# Patient Record
Sex: Female | Born: 1935 | Race: Black or African American | Hispanic: No | Marital: Single
Health system: Southern US, Community
[De-identification: ages and names within clinical notes are randomized; demographics above are authoritative.]

---

## 2014-01-02 ENCOUNTER — Institutional Professional Consult (permissible substitution)
Admission: AD | Admit: 2014-01-02 | Discharge: 2014-01-29 | Disposition: A | Discharge status: Skilled Nursing Facility | Payer: Self-pay | Source: Ambulatory Visit | Attending: Internal Medicine | Admitting: Internal Medicine

## 2014-01-02 DIAGNOSIS — L0291 Cutaneous abscess, unspecified: Secondary | ICD-10-CM

## 2014-01-02 DIAGNOSIS — K56609 Unspecified intestinal obstruction, unspecified as to partial versus complete obstruction: Secondary | ICD-10-CM

## 2014-01-02 DIAGNOSIS — K630 Abscess of intestine: Secondary | ICD-10-CM

## 2014-01-03 ENCOUNTER — Other Ambulatory Visit (HOSPITAL_COMMUNITY): Payer: Self-pay

## 2014-01-03 LAB — COMPREHENSIVE METABOLIC PANEL
ALBUMIN: 1.5 g/dL — AB (ref 3.5–5.2)
ALK PHOS: 123 U/L — AB (ref 39–117)
ALT: 45 U/L — ABNORMAL HIGH (ref 0–35)
AST: 52 U/L — ABNORMAL HIGH (ref 0–37)
BUN: 15 mg/dL (ref 6–23)
CHLORIDE: 106 meq/L (ref 96–112)
CO2: 21 mEq/L (ref 19–32)
CREATININE: 0.59 mg/dL (ref 0.50–1.10)
Calcium: 7.9 mg/dL — ABNORMAL LOW (ref 8.4–10.5)
GFR calc Af Amer: >90 mL/min (ref >90)
GFR calc non Af Amer: 81 mL/min — ABNORMAL LOW (ref >90)
GLUCOSE: 114 mg/dL — AB (ref 70–99)
Potassium: 3.9 mEq/L (ref 3.7–5.3)
Sodium: 140 mEq/L (ref 137–147)
Total Bilirubin: 0.3 mg/dL (ref 0.3–1.2)
Total Protein: 4.8 g/dL — ABNORMAL LOW (ref 6.0–8.3)

## 2014-01-03 LAB — CBC
HEMATOCRIT: 23.8 % — AB (ref 36.0–46.0)
HEMOGLOBIN: 8 g/dL — AB (ref 12.0–15.0)
MCH: 28.3 pg (ref 26.0–34.0)
MCHC: 33.6 g/dL (ref 30.0–36.0)
MCV: 84.1 fL (ref 78.0–100.0)
Platelets: 432 10*3/uL — ABNORMAL HIGH (ref 150–400)
RBC: 2.83 MIL/uL — ABNORMAL LOW (ref 3.87–5.11)
RDW: 14.7 % (ref 11.5–15.5)
WBC: 15.3 10*3/uL — AB (ref 4.0–10.5)

## 2014-01-03 LAB — PREALBUMIN: Prealbumin: 7.6 mg/dL — ABNORMAL LOW (ref 17.0–34.0)

## 2014-01-03 LAB — TSH: TSH: 6.33 u[IU]/mL — ABNORMAL HIGH (ref 0.350–4.500)

## 2014-01-03 LAB — PROCALCITONIN: PROCALCITONIN: 0.95 ng/mL

## 2014-01-04 LAB — T4, FREE: Free T4: 1.06 ng/dL (ref 0.80–1.80)

## 2014-01-04 LAB — CBC
HCT: 23 % — ABNORMAL LOW (ref 36.0–46.0)
Hemoglobin: 7.7 g/dL — ABNORMAL LOW (ref 12.0–15.0)
MCH: 27.9 pg (ref 26.0–34.0)
MCHC: 33.5 g/dL (ref 30.0–36.0)
MCV: 83.3 fL (ref 78.0–100.0)
Platelets: 415 10*3/uL — ABNORMAL HIGH (ref 150–400)
RBC: 2.76 MIL/uL — AB (ref 3.87–5.11)
RDW: 14.6 % (ref 11.5–15.5)
WBC: 16.5 10*3/uL — ABNORMAL HIGH (ref 4.0–10.5)

## 2014-01-04 LAB — T3: T3, Total: 81.8 ng/dl (ref 80.0–204.0)

## 2014-01-04 LAB — VANCOMYCIN, TROUGH: VANCOMYCIN TR: 5.1 ug/mL — AB (ref 10.0–20.0)

## 2014-01-06 LAB — CBC WITH DIFFERENTIAL/PLATELET
Basophils Absolute: 0 10*3/uL (ref 0.0–0.1)
Basophils Relative: 0 % (ref 0–1)
EOS ABS: 0 10*3/uL (ref 0.0–0.7)
Eosinophils Relative: 0 % (ref 0–5)
HCT: 22.9 % — ABNORMAL LOW (ref 36.0–46.0)
Hemoglobin: 7.7 g/dL — ABNORMAL LOW (ref 12.0–15.0)
LYMPHS ABS: 1 10*3/uL (ref 0.7–4.0)
Lymphocytes Relative: 7 % — ABNORMAL LOW (ref 12–46)
MCH: 27.9 pg (ref 26.0–34.0)
MCHC: 33.6 g/dL (ref 30.0–36.0)
MCV: 83 fL (ref 78.0–100.0)
Monocytes Absolute: 2.2 10*3/uL — ABNORMAL HIGH (ref 0.1–1.0)
Monocytes Relative: 16 % — ABNORMAL HIGH (ref 3–12)
NEUTROS ABS: 10.8 10*3/uL — AB (ref 1.7–7.7)
NEUTROS PCT: 77 % (ref 43–77)
Platelets: 536 10*3/uL — ABNORMAL HIGH (ref 150–400)
RBC: 2.76 MIL/uL — ABNORMAL LOW (ref 3.87–5.11)
RDW: 14.7 % (ref 11.5–15.5)
WBC: 14 10*3/uL — ABNORMAL HIGH (ref 4.0–10.5)

## 2014-01-06 LAB — URINALYSIS, ROUTINE W REFLEX MICROSCOPIC
Bilirubin Urine: NEGATIVE
Glucose, UA: NEGATIVE mg/dL
HGB URINE DIPSTICK: NEGATIVE
Ketones, ur: NEGATIVE mg/dL
LEUKOCYTES UA: NEGATIVE
Nitrite: NEGATIVE
Protein, ur: NEGATIVE mg/dL
SPECIFIC GRAVITY, URINE: 1.015 (ref 1.005–1.030)
Urobilinogen, UA: 0.2 mg/dL (ref 0.0–1.0)
pH: 5.5 (ref 5.0–8.0)

## 2014-01-06 LAB — BASIC METABOLIC PANEL
BUN: 13 mg/dL (ref 6–23)
CHLORIDE: 101 meq/L (ref 96–112)
CO2: 26 mEq/L (ref 19–32)
Calcium: 7.9 mg/dL — ABNORMAL LOW (ref 8.4–10.5)
Creatinine, Ser: 0.6 mg/dL (ref 0.50–1.10)
GFR calc Af Amer: >90 mL/min (ref >90)
GFR calc non Af Amer: 81 mL/min — ABNORMAL LOW (ref >90)
GLUCOSE: 140 mg/dL — AB (ref 70–99)
POTASSIUM: 2.8 meq/L — AB (ref 3.7–5.3)
Sodium: 139 mEq/L (ref 137–147)

## 2014-01-06 LAB — VANCOMYCIN, TROUGH: Vancomycin Tr: 17.7 ug/mL (ref 10.0–20.0)

## 2014-01-06 LAB — TRIGLYCERIDES: Triglycerides: 102 mg/dL (ref <150)

## 2014-01-06 LAB — PHOSPHORUS: Phosphorus: 2.6 mg/dL (ref 2.3–4.6)

## 2014-01-06 LAB — MAGNESIUM: Magnesium: 1.7 mg/dL (ref 1.5–2.5)

## 2014-01-07 ENCOUNTER — Other Ambulatory Visit (HOSPITAL_COMMUNITY): Payer: Self-pay

## 2014-01-07 LAB — CBC WITH DIFFERENTIAL/PLATELET
BASOS PCT: 0 % (ref 0–1)
Basophils Absolute: 0 10*3/uL (ref 0.0–0.1)
EOS PCT: 0 % (ref 0–5)
Eosinophils Absolute: 0 10*3/uL (ref 0.0–0.7)
HCT: 23.1 % — ABNORMAL LOW (ref 36.0–46.0)
HEMOGLOBIN: 7.6 g/dL — AB (ref 12.0–15.0)
Lymphocytes Relative: 15 % (ref 12–46)
Lymphs Abs: 1.8 10*3/uL (ref 0.7–4.0)
MCH: 27.6 pg (ref 26.0–34.0)
MCHC: 32.9 g/dL (ref 30.0–36.0)
MCV: 84 fL (ref 78.0–100.0)
MONO ABS: 1.3 10*3/uL — AB (ref 0.1–1.0)
Monocytes Relative: 11 % (ref 3–12)
NEUTROS PCT: 74 % (ref 43–77)
Neutro Abs: 8.6 10*3/uL — ABNORMAL HIGH (ref 1.7–7.7)
PLATELETS: 637 10*3/uL — AB (ref 150–400)
RBC: 2.75 MIL/uL — ABNORMAL LOW (ref 3.87–5.11)
RDW: 14.9 % (ref 11.5–15.5)
WBC: 11.7 10*3/uL — ABNORMAL HIGH (ref 4.0–10.5)

## 2014-01-07 LAB — BASIC METABOLIC PANEL
BUN: 14 mg/dL (ref 6–23)
CALCIUM: 8 mg/dL — AB (ref 8.4–10.5)
CO2: 25 meq/L (ref 19–32)
CREATININE: 0.59 mg/dL (ref 0.50–1.10)
Chloride: 102 mEq/L (ref 96–112)
GFR calc Af Amer: >90 mL/min (ref >90)
GFR, EST NON AFRICAN AMERICAN: 81 mL/min — AB (ref >90)
GLUCOSE: 145 mg/dL — AB (ref 70–99)
Potassium: 3.5 mEq/L — ABNORMAL LOW (ref 3.7–5.3)
SODIUM: 140 meq/L (ref 137–147)

## 2014-01-07 MED ORDER — IOHEXOL 300 MG/ML  SOLN
80.0000 mL | Freq: Once | INTRAMUSCULAR | Status: AC | PRN
  Administered 2014-01-07: 80 mL via INTRAVENOUS

## 2014-01-08 ENCOUNTER — Other Ambulatory Visit (HOSPITAL_COMMUNITY): Payer: Self-pay

## 2014-01-08 LAB — CBC WITH DIFFERENTIAL/PLATELET
BASOS ABS: 0 10*3/uL (ref 0.0–0.1)
Basophils Relative: 0 % (ref 0–1)
EOS PCT: 1 % (ref 0–5)
Eosinophils Absolute: 0.1 10*3/uL (ref 0.0–0.7)
HCT: 22.7 % — ABNORMAL LOW (ref 36.0–46.0)
Hemoglobin: 7.6 g/dL — ABNORMAL LOW (ref 12.0–15.0)
LYMPHS PCT: 8 % — AB (ref 12–46)
Lymphs Abs: 0.8 10*3/uL (ref 0.7–4.0)
MCH: 28.5 pg (ref 26.0–34.0)
MCHC: 33.5 g/dL (ref 30.0–36.0)
MCV: 85 fL (ref 78.0–100.0)
Monocytes Absolute: 1.7 10*3/uL — ABNORMAL HIGH (ref 0.1–1.0)
Monocytes Relative: 16 % — ABNORMAL HIGH (ref 3–12)
NEUTROS PCT: 75 % (ref 43–77)
Neutro Abs: 7.8 10*3/uL — ABNORMAL HIGH (ref 1.7–7.7)
PLATELETS: 612 10*3/uL — AB (ref 150–400)
RBC: 2.67 MIL/uL — AB (ref 3.87–5.11)
RDW: 15.2 % (ref 11.5–15.5)
WBC: 10.4 10*3/uL (ref 4.0–10.5)

## 2014-01-08 LAB — COMPREHENSIVE METABOLIC PANEL
ALBUMIN: 1.5 g/dL — AB (ref 3.5–5.2)
ALK PHOS: 63 U/L (ref 39–117)
ALT: 11 U/L (ref 0–35)
AST: 10 U/L (ref 0–37)
BUN: 15 mg/dL (ref 6–23)
CALCIUM: 7.9 mg/dL — AB (ref 8.4–10.5)
CO2: 28 mEq/L (ref 19–32)
CREATININE: 0.62 mg/dL (ref 0.50–1.10)
Chloride: 104 mEq/L (ref 96–112)
GFR calc Af Amer: >90 mL/min (ref >90)
GFR calc non Af Amer: 80 mL/min — ABNORMAL LOW (ref >90)
Glucose, Bld: 147 mg/dL — ABNORMAL HIGH (ref 70–99)
Potassium: 3.5 mEq/L — ABNORMAL LOW (ref 3.7–5.3)
Sodium: 141 mEq/L (ref 137–147)
TOTAL PROTEIN: 5.1 g/dL — AB (ref 6.0–8.3)
Total Bilirubin: <0.2 mg/dL — ABNORMAL LOW (ref 0.3–1.2)

## 2014-01-08 LAB — URINE CULTURE
COLONY COUNT: NO GROWTH
Culture: NO GROWTH

## 2014-01-08 LAB — PROTIME-INR
INR: 1.62 — ABNORMAL HIGH (ref 0.00–1.49)
PROTHROMBIN TIME: 18.8 s — AB (ref 11.6–15.2)

## 2014-01-08 LAB — MAGNESIUM: MAGNESIUM: 1.7 mg/dL (ref 1.5–2.5)

## 2014-01-08 LAB — PREALBUMIN: PREALBUMIN: 7.9 mg/dL — AB (ref 17.0–34.0)

## 2014-01-08 LAB — APTT: APTT: 39 s — AB (ref 24–37)

## 2014-01-08 LAB — PHOSPHORUS: PHOSPHORUS: 3.4 mg/dL (ref 2.3–4.6)

## 2014-01-08 MED ORDER — MIDAZOLAM HCL 2 MG/2ML IJ SOLN
INTRAMUSCULAR | Status: AC | PRN
  Administered 2014-01-08: 0.5 mg via INTRAVENOUS

## 2014-01-08 MED ORDER — MIDAZOLAM HCL 2 MG/2ML IJ SOLN
INTRAMUSCULAR | Status: AC
  Filled 2014-01-08: qty 4

## 2014-01-08 MED ORDER — FENTANYL CITRATE 0.05 MG/ML IJ SOLN
INTRAMUSCULAR | Status: AC
  Filled 2014-01-08: qty 4

## 2014-01-08 MED ORDER — FENTANYL CITRATE 0.05 MG/ML IJ SOLN
INTRAMUSCULAR | Status: AC | PRN
  Administered 2014-01-08: 25 ug via INTRAVENOUS

## 2014-01-08 NOTE — Procedures (Signed)
Technically successful CT guided drainage catheter placement into the pelvis via R TG approach yielding approximately 40 cc of purulent material.  Samples sent to laboratory.  No immediate post procedural complications.

## 2014-01-08 NOTE — H&P (Signed)
Jill Harris is an 80 y.o. female.   Chief Complaint: Pt admitted to [facility] with abd pain; N/V Diagnosis small bowel obstruction Known diverticular disease Small bowel resection 6/1 Transferred to [facility]/5 for antimicrobial management and rehab Developed fever and worsening abd pain CT 6/10 reveals pelvic abscess Request made for consult for abscess drain placement Dr Pascal Lux has reviewed imaging and chart and now scheduled for same  HPI: diverticular disease; sbo; HTN; GERD  History reviewed. No pertinent past medical history.  No past surgical history on file.  No family history on file. Social History:  has no tobacco, alcohol, and drug history on file.  Allergies: Not on File  No prescriptions prior to admission    Results for orders placed during the hospital encounter of 01/02/14 (from the past 48 hour(s))  VANCOMYCIN, TROUGH     Status: None   Collection Time    01/06/14  9:21 AM      Result Value Ref Range   Vancomycin Tr 17.7  10.0 - 20.0 ug/mL  URINALYSIS, ROUTINE W REFLEX MICROSCOPIC     Status: Abnormal   Collection Time    01/06/14 11:10 PM      Result Value Ref Range   Color, Urine YELLOW  YELLOW   APPearance CLOUDY (*) CLEAR   Specific Gravity, Urine 1.015  1.005 - 1.030   pH 5.5  5.0 - 8.0   Glucose, UA NEGATIVE  NEGATIVE mg/dL   Hgb urine dipstick NEGATIVE  NEGATIVE   Bilirubin Urine NEGATIVE  NEGATIVE   Ketones, ur NEGATIVE  NEGATIVE mg/dL   Protein, ur NEGATIVE  NEGATIVE mg/dL   Urobilinogen, UA 0.2  0.0 - 1.0 mg/dL   Nitrite NEGATIVE  NEGATIVE   Leukocytes, UA NEGATIVE  NEGATIVE   Comment: MICROSCOPIC NOT DONE ON URINES WITH NEGATIVE PROTEIN, BLOOD, LEUKOCYTES, NITRITE, OR GLUCOSE <1000 mg/dL.  URINE CULTURE     Status: None   Collection Time    01/06/14 11:10 PM      Result Value Ref Range   Specimen Description URINE, RANDOM     Special Requests NONE     Culture  Setup Time       Value: 01/07/2014 04:32     Performed at  [facility]       Value: NO GROWTH     Performed at [facility]   Culture       Value: NO GROWTH     Performed at [facility]   Report Status 01/08/2014 FINAL    CBC WITH DIFFERENTIAL     Status: Abnormal   Collection Time    01/07/14  7:50 AM      Result Value Ref Range   WBC 11.7 (*) 4.0 - 10.5 K/uL   RBC 2.75 (*) 3.87 - 5.11 MIL/uL   Hemoglobin 7.6 (*) 12.0 - 15.0 g/dL   HCT 23.1 (*) 36.0 - 46.0 %   MCV 84.0  78.0 - 100.0 fL   MCH 27.6  26.0 - 34.0 pg   MCHC 32.9  30.0 - 36.0 g/dL   RDW 14.9  11.5 - 15.5 %   Platelets 637 (*) 150 - 400 K/uL   Neutrophils Relative % 74  43 - 77 %   Lymphocytes Relative 15  12 - 46 %   Monocytes Relative 11  3 - 12 %   Eosinophils Relative 0  0 - 5 %   Basophils Relative 0  0 -  1 %   Neutro Abs 8.6 (*) 1.7 - 7.7 K/uL   Lymphs Abs 1.8  0.7 - 4.0 K/uL   Monocytes Absolute 1.3 (*) 0.1 - 1.0 K/uL   Eosinophils Absolute 0.0  0.0 - 0.7 K/uL   Basophils Absolute 0.0  0.0 - 0.1 K/uL   WBC Morphology MILD LEFT SHIFT (1-5% METAS, OCC MYELO, OCC BANDS)    BASIC METABOLIC PANEL     Status: Abnormal   Collection Time    01/07/14  7:50 AM      Result Value Ref Range   Sodium 140  137 - 147 mEq/L   Potassium 3.5 (*) 3.7 - 5.3 mEq/L   Chloride 102  96 - 112 mEq/L   CO2 25  19 - 32 mEq/L   Glucose, Bld 145 (*) 70 - 99 mg/dL   BUN 14  6 - 23 mg/dL   Creatinine, Ser 0.59  0.50 - 1.10 mg/dL   Calcium 8.0 (*) 8.4 - 10.5 mg/dL   GFR calc non Af Amer 81 (*) >90 mL/min   GFR calc Af Amer >90  >90 mL/min   Comment: (NOTE)     The eGFR has been calculated using the CKD EPI equation.     This calculation has not been validated in all clinical situations.     eGFR's persistently <90 mL/min signify possible Chronic Kidney     Disease.  COMPREHENSIVE METABOLIC PANEL     Status: Abnormal   Collection Time    01/08/14  7:25 AM      Result Value Ref Range   Sodium 141  137 - 147 mEq/L   Potassium 3.5 (*) 3.7 - 5.3  mEq/L   Chloride 104  96 - 112 mEq/L   CO2 28  19 - 32 mEq/L   Glucose, Bld 147 (*) 70 - 99 mg/dL   BUN 15  6 - 23 mg/dL   Creatinine, Ser 0.62  0.50 - 1.10 mg/dL   Calcium 7.9 (*) 8.4 - 10.5 mg/dL   Total Protein 5.1 (*) 6.0 - 8.3 g/dL   Albumin 1.5 (*) 3.5 - 5.2 g/dL   AST 10  0 - 37 U/L   ALT 11  0 - 35 U/L   Alkaline Phosphatase 63  39 - 117 U/L   Total Bilirubin <0.2 (*) 0.3 - 1.2 mg/dL   GFR calc non Af Amer 80 (*) >90 mL/min   GFR calc Af Amer >90  >90 mL/min   Comment: (NOTE)     The eGFR has been calculated using the CKD EPI equation.     This calculation has not been validated in all clinical situations.     eGFR's persistently <90 mL/min signify possible Chronic Kidney     Disease.  MAGNESIUM     Status: None   Collection Time    01/08/14  7:25 AM      Result Value Ref Range   Magnesium 1.7  1.5 - 2.5 mg/dL  PHOSPHORUS     Status: None   Collection Time    01/08/14  7:25 AM      Result Value Ref Range   Phosphorus 3.4  2.3 - 4.6 mg/dL  CBC WITH DIFFERENTIAL     Status: Abnormal (Preliminary result)   Collection Time    01/08/14  7:25 AM      Result Value Ref Range   WBC 10.4  4.0 - 10.5 K/uL   RBC 2.67 (*) 3.87 - 5.11 MIL/uL   Hemoglobin  7.6 (*) 12.0 - 15.0 g/dL   HCT 22.7 (*) 36.0 - 46.0 %   MCV 85.0  78.0 - 100.0 fL   MCH 28.5  26.0 - 34.0 pg   MCHC 33.5  30.0 - 36.0 g/dL   RDW 15.2  11.5 - 15.5 %   Platelets 612 (*) 150 - 400 K/uL   Neutrophils Relative % PENDING  43 - 77 %   Neutro Abs PENDING  1.7 - 7.7 K/uL   Band Neutrophils PENDING  0 - 10 %   Lymphocytes Relative PENDING  12 - 46 %   Lymphs Abs PENDING  0.7 - 4.0 K/uL   Monocytes Relative PENDING  3 - 12 %   Monocytes Absolute PENDING  0.1 - 1.0 K/uL   Eosinophils Relative PENDING  0 - 5 %   Eosinophils Absolute PENDING  0.0 - 0.7 K/uL   Basophils Relative PENDING  0 - 1 %   Basophils Absolute PENDING  0.0 - 0.1 K/uL   WBC Morphology PENDING     RBC Morphology PENDING     Smear Review  PENDING     nRBC PENDING  0 /100 WBC   Metamyelocytes Relative PENDING     Myelocytes PENDING     Promyelocytes Absolute PENDING     Blasts PENDING     Ct Abdomen Pelvis W Contrast  01/07/2014   CLINICAL DATA:  Small bowel obstruction question abscess  EXAM: CT ABDOMEN AND PELVIS WITH CONTRAST  TECHNIQUE: Multidetector CT imaging of the abdomen and pelvis was performed using the standard protocol following bolus administration of intravenous contrast. Sagittal and coronal MPR images reconstructed from axial data set.  CONTRAST:  61m OMNIPAQUE IOHEXOL 300 MG/ML SOLN IV. No oral contrast administered.  COMPARISON:  None  FINDINGS: Bibasilar pleural effusions and atelectasis, greater on RIGHT.  Beam hardening artifacts in upper and mid abdomen from patient's arms within imaged field.  Liver, spleen, pancreas, and adrenal glands normal appearance.  Tiny BILATERAL renal cysts.  Scattered atherosclerotic calcifications aorta and coronary arteries.  Unremarkable bladder with postsurgical changes of hysterectomy.  Scattered colonic diverticulosis.  Free intraperitoneal fluid perihepatic, interloop, at RIGHT gutter, and in pelvis.  Large bilobed pelvic fluid collection with enhancing margins consistent with abscess, larger right-sided component 11.0 x 4.1 x 7.0 cm communicating with a LEFT pelvic component 6.7 x 1.6 x 2.9 cm.  No definite additional enhancing fluid collections identified.  Bowel anastomoses in RIGHT mid pelvis anteriorly and in LEFT mid abdomen.  Questionable rectal wall thickening versus artifact from incomplete distension posteriorly.  Scattered soft tissue edema.  Stomach and remaining bowel loops unremarkable.  No mass, hernia, or definite free intraperitoneal air.  Scattered degenerative disc and facet disease changes of the thoracolumbar spine with advanced osteoarthritic changes of the RIGHT hip joint.  IMPRESSION: Large bilobed fluid collection with enhancing margins in the pelvis as above,  with BILATERAL pelvic collections larger on RIGHT which appear to communicate across the midline most consistent with pelvic abscess.  Scattered ascites.  Extensive colonic diverticulosis.  Bibasilar pleural effusions and atelectasis.  Questionable rectal wall thickening versus artifact from incomplete distention ; recommend endoscopic evaluation.   Electronically Signed   By: MLavonia DanaM.D.   On: 01/07/2014 13:14    Review of Systems  Constitutional: Positive for fever, chills and weight loss.  Respiratory: Negative for shortness of breath.   Cardiovascular: Negative for chest pain.  Gastrointestinal: Positive for nausea, vomiting and abdominal pain.  Neurological: Positive  for weakness. Negative for dizziness.    There were no vitals taken for this visit. Physical Exam  Constitutional:  febrile Thin; frail  Cardiovascular: Normal rate.   Tachy   Respiratory: Effort normal and breath sounds normal. She has no wheezes.  GI: Soft. Bowel sounds are normal.  Musculoskeletal: Normal range of motion.  Neurological:  Confused although oriented to name and dob  Skin: Skin is warm and dry.  Psychiatric:  Consented son via phone     Assessment/Plan SBO; bowel resec 6/1 Continued pain and fevers CT shows pelvic abscess Now scheduled for abscess drain placement pts son aware of procedure benefits and risks and agreeable to proceed Consent signed and in chart  Laia Wiley A 01/08/2014, 9:02 AM

## 2014-01-10 LAB — BASIC METABOLIC PANEL
BUN: 16 mg/dL (ref 6–23)
CALCIUM: 7.8 mg/dL — AB (ref 8.4–10.5)
CO2: 26 mEq/L (ref 19–32)
Chloride: 100 mEq/L (ref 96–112)
Creatinine, Ser: 0.65 mg/dL (ref 0.50–1.10)
GFR calc Af Amer: >90 mL/min (ref >90)
GFR, EST NON AFRICAN AMERICAN: 79 mL/min — AB (ref >90)
GLUCOSE: 148 mg/dL — AB (ref 70–99)
POTASSIUM: 3.6 meq/L — AB (ref 3.7–5.3)
SODIUM: 138 meq/L (ref 137–147)

## 2014-01-10 LAB — CBC
HEMATOCRIT: 20.8 % — AB (ref 36.0–46.0)
Hemoglobin: 6.7 g/dL — CL (ref 12.0–15.0)
MCH: 27.3 pg (ref 26.0–34.0)
MCHC: 32.2 g/dL (ref 30.0–36.0)
MCV: 84.9 fL (ref 78.0–100.0)
Platelets: 708 10*3/uL — ABNORMAL HIGH (ref 150–400)
RBC: 2.45 MIL/uL — ABNORMAL LOW (ref 3.87–5.11)
RDW: 15.3 % (ref 11.5–15.5)
WBC: 8 10*3/uL (ref 4.0–10.5)

## 2014-01-10 LAB — PREPARE RBC (CROSSMATCH)

## 2014-01-10 LAB — ABO/RH: ABO/RH(D): A POS

## 2014-01-11 LAB — CULTURE, BLOOD (ROUTINE X 2)
CULTURE: NO GROWTH
Culture: NO GROWTH

## 2014-01-11 LAB — TYPE AND SCREEN
ABO/RH(D): A POS
ANTIBODY SCREEN: NEGATIVE
Unit division: 0
Unit division: 0

## 2014-01-11 LAB — CBC
HCT: 29.9 % — ABNORMAL LOW (ref 36.0–46.0)
Hemoglobin: 9.8 g/dL — ABNORMAL LOW (ref 12.0–15.0)
MCH: 28.2 pg (ref 26.0–34.0)
MCHC: 32.8 g/dL (ref 30.0–36.0)
MCV: 85.9 fL (ref 78.0–100.0)
PLATELETS: 646 10*3/uL — AB (ref 150–400)
RBC: 3.48 MIL/uL — ABNORMAL LOW (ref 3.87–5.11)
RDW: 15.3 % (ref 11.5–15.5)
WBC: 9.5 10*3/uL (ref 4.0–10.5)

## 2014-01-11 LAB — POTASSIUM: Potassium: 3.5 mEq/L — ABNORMAL LOW (ref 3.7–5.3)

## 2014-01-12 LAB — COMPREHENSIVE METABOLIC PANEL
ALBUMIN: 1.5 g/dL — AB (ref 3.5–5.2)
ALK PHOS: 47 U/L (ref 39–117)
ALK PHOS: 49 U/L (ref 39–117)
ALT: 6 U/L (ref 0–35)
ALT: 6 U/L (ref 0–35)
AST: 12 U/L (ref 0–37)
AST: 11 U/L (ref 0–37)
Albumin: 1.6 g/dL — ABNORMAL LOW (ref 3.5–5.2)
BILIRUBIN TOTAL: 0.3 mg/dL (ref 0.3–1.2)
BUN: 19 mg/dL (ref 6–23)
BUN: 19 mg/dL (ref 6–23)
CHLORIDE: 101 meq/L (ref 96–112)
CHLORIDE: 101 meq/L (ref 96–112)
CO2: 28 mEq/L (ref 19–32)
CO2: 27 meq/L (ref 19–32)
Calcium: 7.9 mg/dL — ABNORMAL LOW (ref 8.4–10.5)
Calcium: 8 mg/dL — ABNORMAL LOW (ref 8.4–10.5)
Creatinine, Ser: 0.61 mg/dL (ref 0.50–1.10)
Creatinine, Ser: 0.58 mg/dL (ref 0.50–1.10)
GFR calc Af Amer: >90 mL/min (ref >90)
GFR calc Af Amer: >90 mL/min (ref >90)
GFR calc non Af Amer: 80 mL/min — ABNORMAL LOW (ref >90)
GFR, EST NON AFRICAN AMERICAN: 82 mL/min — AB (ref >90)
Glucose, Bld: 143 mg/dL — ABNORMAL HIGH (ref 70–99)
Glucose, Bld: 135 mg/dL — ABNORMAL HIGH (ref 70–99)
POTASSIUM: 3.6 meq/L — AB (ref 3.7–5.3)
Potassium: 3.5 mEq/L — ABNORMAL LOW (ref 3.7–5.3)
SODIUM: 139 meq/L (ref 137–147)
Sodium: 137 mEq/L (ref 137–147)
Total Bilirubin: 0.3 mg/dL (ref 0.3–1.2)
Total Protein: 5.4 g/dL — ABNORMAL LOW (ref 6.0–8.3)
Total Protein: 5.6 g/dL — ABNORMAL LOW (ref 6.0–8.3)

## 2014-01-12 LAB — CBC WITH DIFFERENTIAL/PLATELET
Basophils Absolute: 0.1 10*3/uL (ref 0.0–0.1)
Basophils Relative: 1 % (ref 0–1)
EOS PCT: 0 % (ref 0–5)
Eosinophils Absolute: 0 10*3/uL (ref 0.0–0.7)
HCT: 29.7 % — ABNORMAL LOW (ref 36.0–46.0)
Hemoglobin: 9.9 g/dL — ABNORMAL LOW (ref 12.0–15.0)
LYMPHS ABS: 0.9 10*3/uL (ref 0.7–4.0)
Lymphocytes Relative: 12 % (ref 12–46)
MCH: 29.3 pg (ref 26.0–34.0)
MCHC: 33.3 g/dL (ref 30.0–36.0)
MCV: 87.9 fL (ref 78.0–100.0)
Monocytes Absolute: 1.6 10*3/uL — ABNORMAL HIGH (ref 0.1–1.0)
Monocytes Relative: 23 % — ABNORMAL HIGH (ref 3–12)
NEUTROS PCT: 64 % (ref 43–77)
Neutro Abs: 4.5 10*3/uL (ref 1.7–7.7)
Platelets: 619 10*3/uL — ABNORMAL HIGH (ref 150–400)
RBC: 3.38 MIL/uL — ABNORMAL LOW (ref 3.87–5.11)
RDW: 16.1 % — ABNORMAL HIGH (ref 11.5–15.5)
WBC: 7.1 10*3/uL (ref 4.0–10.5)

## 2014-01-12 LAB — CULTURE, ROUTINE-ABSCESS

## 2014-01-12 LAB — MAGNESIUM: MAGNESIUM: 1.8 mg/dL (ref 1.5–2.5)

## 2014-01-12 LAB — PHOSPHORUS: Phosphorus: 2.9 mg/dL (ref 2.3–4.6)

## 2014-01-12 LAB — TRIGLYCERIDES: Triglycerides: 90 mg/dL (ref <150)

## 2014-01-12 LAB — VANCOMYCIN, TROUGH: Vancomycin Tr: 21.4 ug/mL — ABNORMAL HIGH (ref 10.0–20.0)

## 2014-01-12 LAB — PREALBUMIN: Prealbumin: 11.9 mg/dL — ABNORMAL LOW (ref 17.0–34.0)

## 2014-01-13 ENCOUNTER — Other Ambulatory Visit (HOSPITAL_COMMUNITY): Payer: Self-pay

## 2014-01-13 ENCOUNTER — Encounter: Payer: Self-pay | Admitting: Radiology

## 2014-01-13 MED ORDER — IOHEXOL 300 MG/ML  SOLN
100.0000 mL | Freq: Once | INTRAMUSCULAR | Status: AC | PRN
Start: 2014-01-13 — End: 2014-01-13
  Administered 2014-01-13: 100 mL via INTRAVENOUS

## 2014-01-15 LAB — DIFFERENTIAL
BASOS ABS: 0 10*3/uL (ref 0.0–0.1)
BASOS PCT: 0 % (ref 0–1)
EOS ABS: 0.1 10*3/uL (ref 0.0–0.7)
Eosinophils Relative: 1 % (ref 0–5)
Lymphocytes Relative: 30 % (ref 12–46)
Lymphs Abs: 1.7 10*3/uL (ref 0.7–4.0)
Monocytes Absolute: 1 10*3/uL (ref 0.1–1.0)
Monocytes Relative: 18 % — ABNORMAL HIGH (ref 3–12)
NEUTROS PCT: 51 % (ref 43–77)
Neutro Abs: 2.9 10*3/uL (ref 1.7–7.7)

## 2014-01-15 LAB — CBC
HCT: 30.5 % — ABNORMAL LOW (ref 36.0–46.0)
HEMOGLOBIN: 9.7 g/dL — AB (ref 12.0–15.0)
MCH: 28.4 pg (ref 26.0–34.0)
MCHC: 31.8 g/dL (ref 30.0–36.0)
MCV: 89.4 fL (ref 78.0–100.0)
PLATELETS: 498 10*3/uL — AB (ref 150–400)
RBC: 3.41 MIL/uL — ABNORMAL LOW (ref 3.87–5.11)
RDW: 17.3 % — ABNORMAL HIGH (ref 11.5–15.5)
WBC: 5.3 10*3/uL (ref 4.0–10.5)

## 2014-01-15 LAB — BASIC METABOLIC PANEL
BUN: 23 mg/dL (ref 6–23)
CO2: 24 mEq/L (ref 19–32)
Calcium: 8 mg/dL — ABNORMAL LOW (ref 8.4–10.5)
Chloride: 103 mEq/L (ref 96–112)
Creatinine, Ser: 0.65 mg/dL (ref 0.50–1.10)
GFR, EST NON AFRICAN AMERICAN: 79 mL/min — AB (ref >90)
Glucose, Bld: 123 mg/dL — ABNORMAL HIGH (ref 70–99)
POTASSIUM: 4.1 meq/L (ref 3.7–5.3)
Sodium: 140 mEq/L (ref 137–147)

## 2014-01-16 LAB — CK TOTAL AND CKMB (NOT AT ARMC)
CK TOTAL: 24 U/L (ref 7–177)
CK, MB: 1.5 ng/mL (ref 0.3–4.0)
CK, MB: 1 ng/mL (ref 0.3–4.0)
Relative Index: INVALID (ref 0.0–2.5)
Relative Index: INVALID (ref 0.0–2.5)
Total CK: 26 U/L (ref 7–177)

## 2014-01-16 LAB — VANCOMYCIN, TROUGH: VANCOMYCIN TR: 23.7 ug/mL — AB (ref 10.0–20.0)

## 2014-01-16 LAB — POTASSIUM: Potassium: 4.6 mEq/L (ref 3.7–5.3)

## 2014-01-16 LAB — MAGNESIUM: Magnesium: 2 mg/dL (ref 1.5–2.5)

## 2014-01-16 LAB — TROPONIN I
Troponin I: <0.3 ng/mL (ref <0.30)
Troponin I: <0.3 ng/mL (ref <0.30)

## 2014-01-16 LAB — CALCIUM, IONIZED: Calcium, Ion: 1.2 mmol/L (ref 1.13–1.30)

## 2014-01-17 LAB — CBC WITH DIFFERENTIAL/PLATELET
BASOS ABS: 0 10*3/uL (ref 0.0–0.1)
Basophils Relative: 0 % (ref 0–1)
Eosinophils Absolute: 0.1 10*3/uL (ref 0.0–0.7)
Eosinophils Relative: 1 % (ref 0–5)
HEMATOCRIT: 28.1 % — AB (ref 36.0–46.0)
Hemoglobin: 8.8 g/dL — ABNORMAL LOW (ref 12.0–15.0)
Lymphocytes Relative: 24 % (ref 12–46)
Lymphs Abs: 1.3 10*3/uL (ref 0.7–4.0)
MCH: 28 pg (ref 26.0–34.0)
MCHC: 31.3 g/dL (ref 30.0–36.0)
MCV: 89.5 fL (ref 78.0–100.0)
Monocytes Absolute: 1.3 10*3/uL — ABNORMAL HIGH (ref 0.1–1.0)
Monocytes Relative: 25 % — ABNORMAL HIGH (ref 3–12)
Neutro Abs: 2.6 10*3/uL (ref 1.7–7.7)
Neutrophils Relative %: 50 % (ref 43–77)
Platelets: 482 10*3/uL — ABNORMAL HIGH (ref 150–400)
RBC: 3.14 MIL/uL — ABNORMAL LOW (ref 3.87–5.11)
RDW: 17.6 % — AB (ref 11.5–15.5)
WBC: 5.2 10*3/uL (ref 4.0–10.5)

## 2014-01-17 LAB — BASIC METABOLIC PANEL
BUN: 28 mg/dL — ABNORMAL HIGH (ref 6–23)
CALCIUM: 8.3 mg/dL — AB (ref 8.4–10.5)
CO2: 23 mEq/L (ref 19–32)
Chloride: 101 mEq/L (ref 96–112)
Creatinine, Ser: 0.7 mg/dL (ref 0.50–1.10)
GFR calc Af Amer: 89 mL/min — ABNORMAL LOW (ref >90)
GFR calc non Af Amer: 77 mL/min — ABNORMAL LOW (ref >90)
Glucose, Bld: 110 mg/dL — ABNORMAL HIGH (ref 70–99)
Potassium: 4.9 mEq/L (ref 3.7–5.3)
Sodium: 138 mEq/L (ref 137–147)

## 2014-01-18 LAB — VANCOMYCIN, TROUGH: VANCOMYCIN TR: 19.3 ug/mL (ref 10.0–20.0)

## 2014-01-19 ENCOUNTER — Other Ambulatory Visit (HOSPITAL_COMMUNITY): Payer: Self-pay

## 2014-01-19 ENCOUNTER — Encounter: Payer: Self-pay | Admitting: Radiology

## 2014-01-19 LAB — BASIC METABOLIC PANEL
BUN: 25 mg/dL — ABNORMAL HIGH (ref 6–23)
CHLORIDE: 104 meq/L (ref 96–112)
CO2: 25 meq/L (ref 19–32)
Calcium: 8.8 mg/dL (ref 8.4–10.5)
Creatinine, Ser: 0.68 mg/dL (ref 0.50–1.10)
GFR calc Af Amer: 90 mL/min — ABNORMAL LOW (ref >90)
GFR calc non Af Amer: 78 mL/min — ABNORMAL LOW (ref >90)
GLUCOSE: 151 mg/dL — AB (ref 70–99)
Potassium: 4.6 mEq/L (ref 3.7–5.3)
Sodium: 141 mEq/L (ref 137–147)

## 2014-01-19 LAB — CBC WITH DIFFERENTIAL/PLATELET
BASOS ABS: 0 10*3/uL (ref 0.0–0.1)
Basophils Relative: 0 % (ref 0–1)
Eosinophils Absolute: 0 10*3/uL (ref 0.0–0.7)
Eosinophils Relative: 1 % (ref 0–5)
HEMATOCRIT: 32.2 % — AB (ref 36.0–46.0)
Hemoglobin: 10 g/dL — ABNORMAL LOW (ref 12.0–15.0)
LYMPHS ABS: 1.3 10*3/uL (ref 0.7–4.0)
LYMPHS PCT: 18 % (ref 12–46)
MCH: 27.9 pg (ref 26.0–34.0)
MCHC: 31.1 g/dL (ref 30.0–36.0)
MCV: 89.7 fL (ref 78.0–100.0)
MONO ABS: 1.4 10*3/uL — AB (ref 0.1–1.0)
Monocytes Relative: 20 % — ABNORMAL HIGH (ref 3–12)
NEUTROS ABS: 4.3 10*3/uL (ref 1.7–7.7)
Neutrophils Relative %: 61 % (ref 43–77)
Platelets: 542 10*3/uL — ABNORMAL HIGH (ref 150–400)
RBC: 3.59 MIL/uL — ABNORMAL LOW (ref 3.87–5.11)
RDW: 17.6 % — AB (ref 11.5–15.5)
WBC: 7 10*3/uL (ref 4.0–10.5)

## 2014-01-19 LAB — PREALBUMIN
PREALBUMIN: 18.8 mg/dL (ref 17.0–34.0)
Prealbumin: 20.5 mg/dL (ref 17.0–34.0)

## 2014-01-19 LAB — MAGNESIUM: Magnesium: 1.8 mg/dL (ref 1.5–2.5)

## 2014-01-19 LAB — PHOSPHORUS: PHOSPHORUS: 3.5 mg/dL (ref 2.3–4.6)

## 2014-01-19 MED ORDER — MIDAZOLAM HCL 2 MG/2ML IJ SOLN
INTRAMUSCULAR | Status: AC
  Filled 2014-01-19: qty 2

## 2014-01-19 MED ORDER — FENTANYL CITRATE 0.05 MG/ML IJ SOLN
INTRAMUSCULAR | Status: AC | PRN
  Administered 2014-01-19: 12.5 ug via INTRAVENOUS

## 2014-01-19 MED ORDER — MIDAZOLAM HCL 2 MG/2ML IJ SOLN
INTRAMUSCULAR | Status: AC | PRN
  Administered 2014-01-19: 0.5 mg via INTRAVENOUS

## 2014-01-19 MED ORDER — FENTANYL CITRATE 0.05 MG/ML IJ SOLN
INTRAMUSCULAR | Status: AC
  Filled 2014-01-19: qty 2

## 2014-01-19 NOTE — Procedures (Signed)
CT abscess drain exchange to 50F No complication No blood loss. [facility].

## 2014-01-19 NOTE — Sedation Documentation (Signed)
Patient denies pain and is resting comfortably.  

## 2014-01-20 ENCOUNTER — Other Ambulatory Visit (HOSPITAL_COMMUNITY): Payer: Self-pay

## 2014-01-21 ENCOUNTER — Other Ambulatory Visit (HOSPITAL_COMMUNITY): Payer: Self-pay

## 2014-01-21 LAB — CBC WITH DIFFERENTIAL/PLATELET
Basophils Absolute: 0 10*3/uL (ref 0.0–0.1)
Basophils Relative: 0 % (ref 0–1)
Eosinophils Absolute: 0 10*3/uL (ref 0.0–0.7)
Eosinophils Relative: 0 % (ref 0–5)
HCT: 29.2 % — ABNORMAL LOW (ref 36.0–46.0)
Hemoglobin: 9.4 g/dL — ABNORMAL LOW (ref 12.0–15.0)
LYMPHS PCT: 16 % (ref 12–46)
Lymphs Abs: 1.3 10*3/uL (ref 0.7–4.0)
MCH: 29.1 pg (ref 26.0–34.0)
MCHC: 32.2 g/dL (ref 30.0–36.0)
MCV: 90.4 fL (ref 78.0–100.0)
Monocytes Absolute: 1.3 10*3/uL — ABNORMAL HIGH (ref 0.1–1.0)
Monocytes Relative: 16 % — ABNORMAL HIGH (ref 3–12)
NEUTROS ABS: 5.5 10*3/uL (ref 1.7–7.7)
Neutrophils Relative %: 68 % (ref 43–77)
PLATELETS: 438 10*3/uL — AB (ref 150–400)
RBC: 3.23 MIL/uL — AB (ref 3.87–5.11)
RDW: 18 % — ABNORMAL HIGH (ref 11.5–15.5)
WBC: 8.1 10*3/uL (ref 4.0–10.5)

## 2014-01-21 LAB — BASIC METABOLIC PANEL
BUN: 25 mg/dL — ABNORMAL HIGH (ref 6–23)
CALCIUM: 8.5 mg/dL (ref 8.4–10.5)
CHLORIDE: 103 meq/L (ref 96–112)
CO2: 23 meq/L (ref 19–32)
Creatinine, Ser: 0.83 mg/dL (ref 0.50–1.10)
GFR calc Af Amer: 72 mL/min — ABNORMAL LOW (ref >90)
GFR calc non Af Amer: 63 mL/min — ABNORMAL LOW (ref >90)
Glucose, Bld: 110 mg/dL — ABNORMAL HIGH (ref 70–99)
Potassium: 4.8 mEq/L (ref 3.7–5.3)
SODIUM: 141 meq/L (ref 137–147)

## 2014-01-21 LAB — MAGNESIUM: MAGNESIUM: 2 mg/dL (ref 1.5–2.5)

## 2014-01-21 LAB — PHOSPHORUS: PHOSPHORUS: 3.3 mg/dL (ref 2.3–4.6)

## 2014-01-22 ENCOUNTER — Other Ambulatory Visit (HOSPITAL_COMMUNITY): Payer: Self-pay

## 2014-01-22 LAB — CBC WITH DIFFERENTIAL/PLATELET
Basophils Absolute: 0 10*3/uL (ref 0.0–0.1)
Basophils Relative: 0 % (ref 0–1)
Eosinophils Absolute: 0 10*3/uL (ref 0.0–0.7)
Eosinophils Relative: 0 % (ref 0–5)
HCT: 27.3 % — ABNORMAL LOW (ref 36.0–46.0)
HEMOGLOBIN: 8.7 g/dL — AB (ref 12.0–15.0)
Lymphocytes Relative: 19 % (ref 12–46)
Lymphs Abs: 1.2 10*3/uL (ref 0.7–4.0)
MCH: 28.4 pg (ref 26.0–34.0)
MCHC: 31.9 g/dL (ref 30.0–36.0)
MCV: 89.2 fL (ref 78.0–100.0)
MONO ABS: 1 10*3/uL (ref 0.1–1.0)
Monocytes Relative: 15 % — ABNORMAL HIGH (ref 3–12)
NEUTROS ABS: 4.2 10*3/uL (ref 1.7–7.7)
NEUTROS PCT: 66 % (ref 43–77)
Platelets: 431 10*3/uL — ABNORMAL HIGH (ref 150–400)
RBC: 3.06 MIL/uL — ABNORMAL LOW (ref 3.87–5.11)
RDW: 18.5 % — ABNORMAL HIGH (ref 11.5–15.5)
WBC: 6.5 10*3/uL (ref 4.0–10.5)

## 2014-01-22 LAB — BASIC METABOLIC PANEL
BUN: 25 mg/dL — AB (ref 6–23)
CHLORIDE: 102 meq/L (ref 96–112)
CO2: 23 mEq/L (ref 19–32)
Calcium: 8.2 mg/dL — ABNORMAL LOW (ref 8.4–10.5)
Creatinine, Ser: 0.86 mg/dL (ref 0.50–1.10)
GFR calc non Af Amer: 60 mL/min — ABNORMAL LOW (ref >90)
GFR, EST AFRICAN AMERICAN: 69 mL/min — AB (ref >90)
Glucose, Bld: 117 mg/dL — ABNORMAL HIGH (ref 70–99)
POTASSIUM: 4.5 meq/L (ref 3.7–5.3)
Sodium: 140 mEq/L (ref 137–147)

## 2014-01-23 LAB — PROTIME-INR
INR: 1.51 — ABNORMAL HIGH (ref 0.00–1.49)
Prothrombin Time: 18.2 seconds — ABNORMAL HIGH (ref 11.6–15.2)

## 2014-01-24 ENCOUNTER — Other Ambulatory Visit (HOSPITAL_COMMUNITY): Payer: Self-pay

## 2014-01-24 LAB — CBC WITH DIFFERENTIAL/PLATELET
BASOS PCT: 0 % (ref 0–1)
Basophils Absolute: 0 10*3/uL (ref 0.0–0.1)
EOS PCT: 1 % (ref 0–5)
Eosinophils Absolute: 0.1 10*3/uL (ref 0.0–0.7)
HCT: 30.4 % — ABNORMAL LOW (ref 36.0–46.0)
HEMOGLOBIN: 9.4 g/dL — AB (ref 12.0–15.0)
Lymphocytes Relative: 16 % (ref 12–46)
Lymphs Abs: 1.2 10*3/uL (ref 0.7–4.0)
MCH: 28.1 pg (ref 26.0–34.0)
MCHC: 30.9 g/dL (ref 30.0–36.0)
MCV: 90.7 fL (ref 78.0–100.0)
MONO ABS: 1.3 10*3/uL — AB (ref 0.1–1.0)
MONOS PCT: 18 % — AB (ref 3–12)
NEUTROS PCT: 65 % (ref 43–77)
Neutro Abs: 4.8 10*3/uL (ref 1.7–7.7)
Platelets: 427 10*3/uL — ABNORMAL HIGH (ref 150–400)
RBC: 3.35 MIL/uL — AB (ref 3.87–5.11)
RDW: 19.1 % — ABNORMAL HIGH (ref 11.5–15.5)
WBC: 7.4 10*3/uL (ref 4.0–10.5)

## 2014-01-24 LAB — BASIC METABOLIC PANEL
BUN: 26 mg/dL — ABNORMAL HIGH (ref 6–23)
CALCIUM: 8.2 mg/dL — AB (ref 8.4–10.5)
CO2: 21 meq/L (ref 19–32)
Chloride: 102 mEq/L (ref 96–112)
Creatinine, Ser: 0.83 mg/dL (ref 0.50–1.10)
GFR calc Af Amer: 72 mL/min — ABNORMAL LOW (ref >90)
GFR calc non Af Amer: 63 mL/min — ABNORMAL LOW (ref >90)
GLUCOSE: 113 mg/dL — AB (ref 70–99)
Potassium: 4.6 mEq/L (ref 3.7–5.3)
Sodium: 138 mEq/L (ref 137–147)

## 2014-01-26 ENCOUNTER — Encounter: Payer: Self-pay | Admitting: Radiology

## 2014-01-26 ENCOUNTER — Other Ambulatory Visit (HOSPITAL_COMMUNITY): Payer: Self-pay

## 2014-01-26 LAB — CBC WITH DIFFERENTIAL/PLATELET
Basophils Absolute: 0 10*3/uL (ref 0.0–0.1)
Basophils Relative: 0 % (ref 0–1)
EOS ABS: 0.1 10*3/uL (ref 0.0–0.7)
Eosinophils Relative: 1 % (ref 0–5)
HCT: 33.7 % — ABNORMAL LOW (ref 36.0–46.0)
HEMOGLOBIN: 10.6 g/dL — AB (ref 12.0–15.0)
LYMPHS ABS: 1.3 10*3/uL (ref 0.7–4.0)
Lymphocytes Relative: 17 % (ref 12–46)
MCH: 29 pg (ref 26.0–34.0)
MCHC: 31.5 g/dL (ref 30.0–36.0)
MCV: 92.3 fL (ref 78.0–100.0)
MONO ABS: 1.8 10*3/uL — AB (ref 0.1–1.0)
MONOS PCT: 22 % — AB (ref 3–12)
Neutro Abs: 4.8 10*3/uL (ref 1.7–7.7)
Neutrophils Relative %: 60 % (ref 43–77)
Platelets: 371 10*3/uL (ref 150–400)
RBC: 3.65 MIL/uL — ABNORMAL LOW (ref 3.87–5.11)
RDW: 19.8 % — ABNORMAL HIGH (ref 11.5–15.5)
WBC: 8 10*3/uL (ref 4.0–10.5)

## 2014-01-26 LAB — COMPREHENSIVE METABOLIC PANEL
ALT: 206 U/L — AB (ref 0–35)
AST: 131 U/L — ABNORMAL HIGH (ref 0–37)
Albumin: 2.1 g/dL — ABNORMAL LOW (ref 3.5–5.2)
Alkaline Phosphatase: 252 U/L — ABNORMAL HIGH (ref 39–117)
BUN: 18 mg/dL (ref 6–23)
CALCIUM: 8.5 mg/dL (ref 8.4–10.5)
CO2: 23 meq/L (ref 19–32)
CREATININE: 0.79 mg/dL (ref 0.50–1.10)
Chloride: 101 mEq/L (ref 96–112)
GFR calc Af Amer: 86 mL/min — ABNORMAL LOW (ref >90)
GFR, EST NON AFRICAN AMERICAN: 74 mL/min — AB (ref >90)
GLUCOSE: 110 mg/dL — AB (ref 70–99)
Potassium: 4.9 mEq/L (ref 3.7–5.3)
SODIUM: 139 meq/L (ref 137–147)
Total Bilirubin: 0.5 mg/dL (ref 0.3–1.2)
Total Protein: 6 g/dL (ref 6.0–8.3)

## 2014-01-26 LAB — PREALBUMIN: PREALBUMIN: 11.8 mg/dL — AB (ref 17.0–34.0)

## 2014-01-26 LAB — VANCOMYCIN, TROUGH: Vancomycin Tr: 28.5 ug/mL (ref 10.0–20.0)

## 2014-01-26 LAB — TSH: TSH: 2.36 u[IU]/mL (ref 0.350–4.500)

## 2014-01-26 LAB — T3, FREE: T3 FREE: 2.3 pg/mL (ref 2.3–4.2)

## 2014-01-26 LAB — T4, FREE: Free T4: 1.41 ng/dL (ref 0.80–1.80)

## 2014-01-26 MED ORDER — IOHEXOL 300 MG/ML  SOLN
100.0000 mL | Freq: Once | INTRAMUSCULAR | Status: AC | PRN
  Administered 2014-01-26: 100 mL via INTRAVENOUS

## 2014-01-27 ENCOUNTER — Other Ambulatory Visit (HOSPITAL_COMMUNITY): Payer: Self-pay

## 2014-01-27 LAB — HEPATITIS B SURFACE ANTIBODY,QUALITATIVE: Hep B S Ab: NEGATIVE

## 2014-01-27 LAB — HEPATITIS C ANTIBODY: HCV Ab: NEGATIVE

## 2014-01-27 LAB — HEPATITIS B SURFACE ANTIGEN: Hepatitis B Surface Ag: NEGATIVE

## 2014-01-27 MED ORDER — IOHEXOL 300 MG/ML  SOLN
50.0000 mL | Freq: Once | INTRAMUSCULAR | Status: AC | PRN
  Administered 2014-01-27: 3 mL

## 2014-01-28 LAB — HEPATITIS B E ANTIGEN: Hep B E Ag: NONREACTIVE

## 2014-01-29 LAB — COMPREHENSIVE METABOLIC PANEL
ALT: 84 U/L — AB (ref 0–35)
AST: 28 U/L (ref 0–37)
Albumin: 2.1 g/dL — ABNORMAL LOW (ref 3.5–5.2)
Alkaline Phosphatase: 172 U/L — ABNORMAL HIGH (ref 39–117)
Anion gap: 13 (ref 5–15)
BUN: 15 mg/dL (ref 6–23)
CO2: 23 meq/L (ref 19–32)
Calcium: 8.5 mg/dL (ref 8.4–10.5)
Chloride: 102 mEq/L (ref 96–112)
Creatinine, Ser: 0.82 mg/dL (ref 0.50–1.10)
GFR calc Af Amer: 74 mL/min — ABNORMAL LOW (ref >90)
GFR, EST NON AFRICAN AMERICAN: 63 mL/min — AB (ref >90)
GLUCOSE: 105 mg/dL — AB (ref 70–99)
Potassium: 5.2 mEq/L (ref 3.7–5.3)
SODIUM: 138 meq/L (ref 137–147)
Total Bilirubin: 0.6 mg/dL (ref 0.3–1.2)
Total Protein: 6 g/dL (ref 6.0–8.3)

## 2015-06-01 DEATH — deceased

## 2016-02-11 IMAGING — CR DG CHEST 1V PORT
1 series · 1 of 1 positions shown · non-contrast
Comparison: None.

CLINICAL DATA: Sepsis/wound

EXAM:
PORTABLE CHEST - 1 VIEW

[AP]
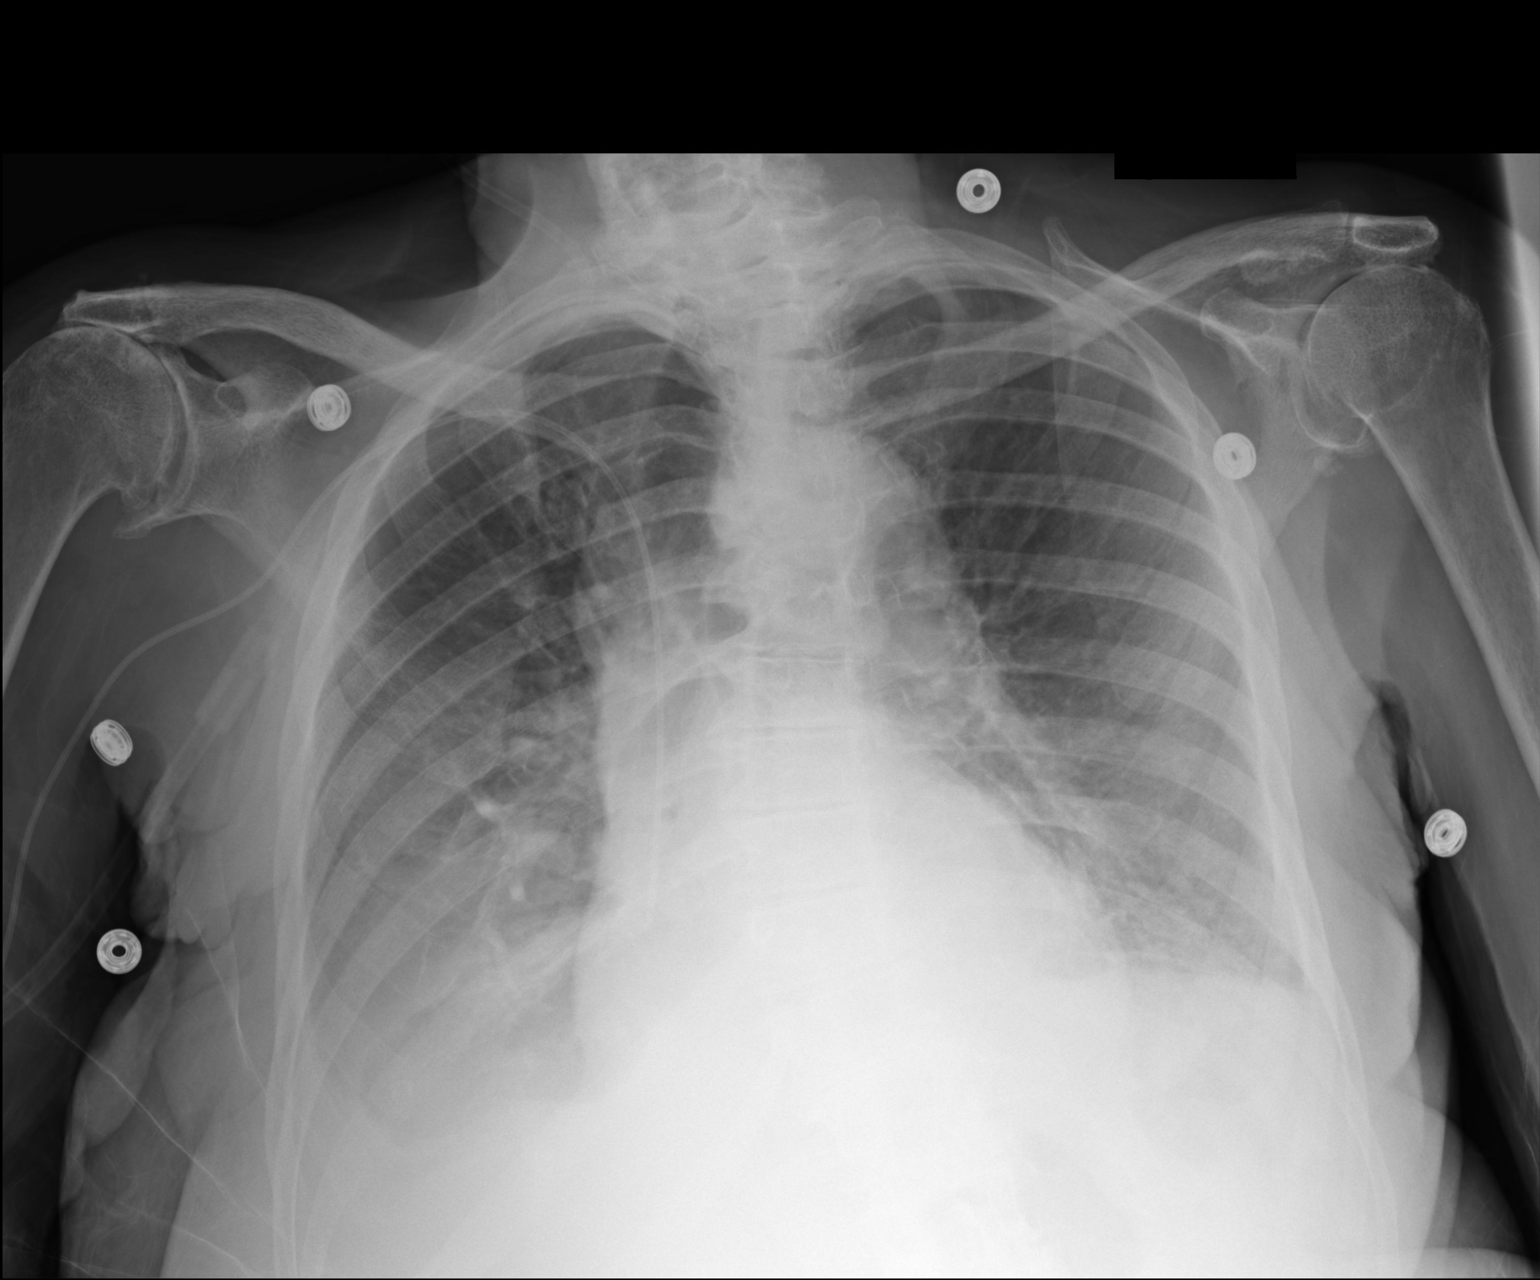

[1 of 1 positions shown; findings below may reference images not displayed]

FINDINGS: Moderate right and small left pleural effusions. Associated
bilateral lower lobe opacities, likely atelectasis. Mild
interstitial edema is possible but not convincing.

The heart is top-normal in size.

Right arm PICC terminates at the cavoatrial junction.
IMPRESSION: Moderate right and small left pleural effusions.

Associated bilateral lower lobe opacities, likely atelectasis.

Possible mild interstitial edema, equivocal.

## 2016-03-01 IMAGING — US US CHEST/MEDIASTINUM
1 series · 8 of 8 positions shown · non-contrast
Comparison: Chest x-ray 01/21/2014.

CLINICAL DATA: Evaluate pleural effusion.

EXAM:
CHEST ULTRASOUND

[Series 1: us chest/mediastinum · 0.27mm/px · 8 of 8 slices shown]
[im 1/8]
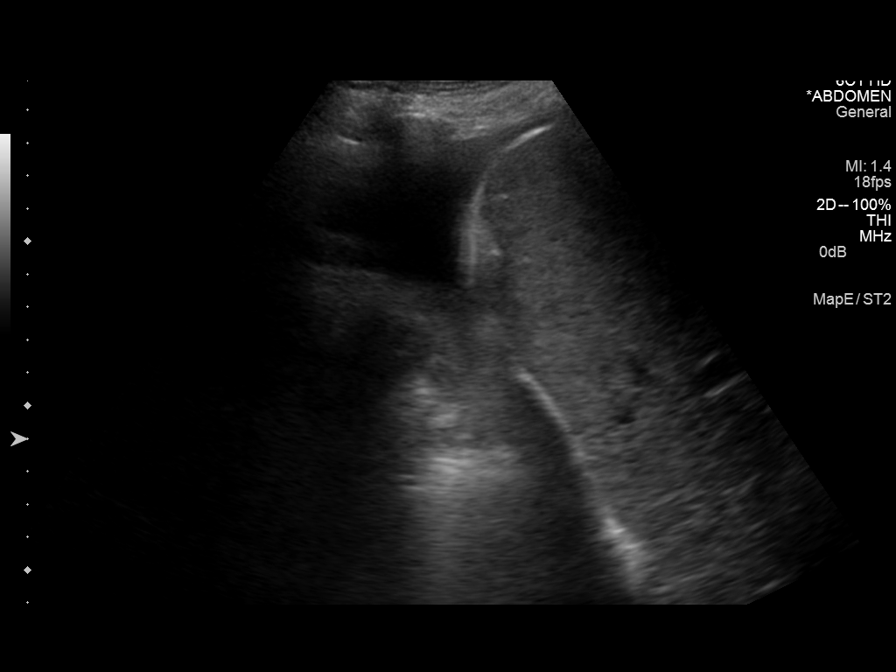
[im 2/8]
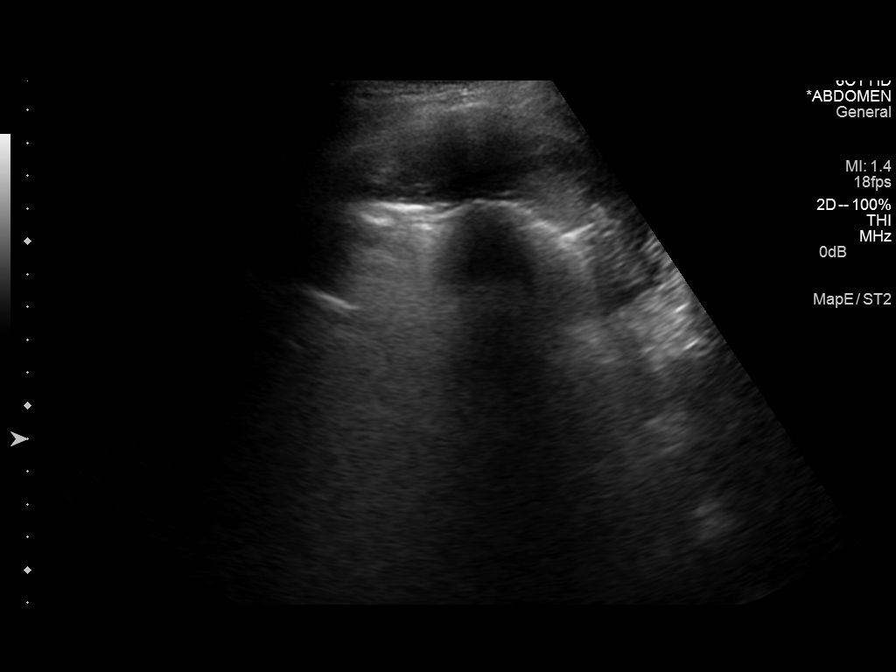
[im 3/8]
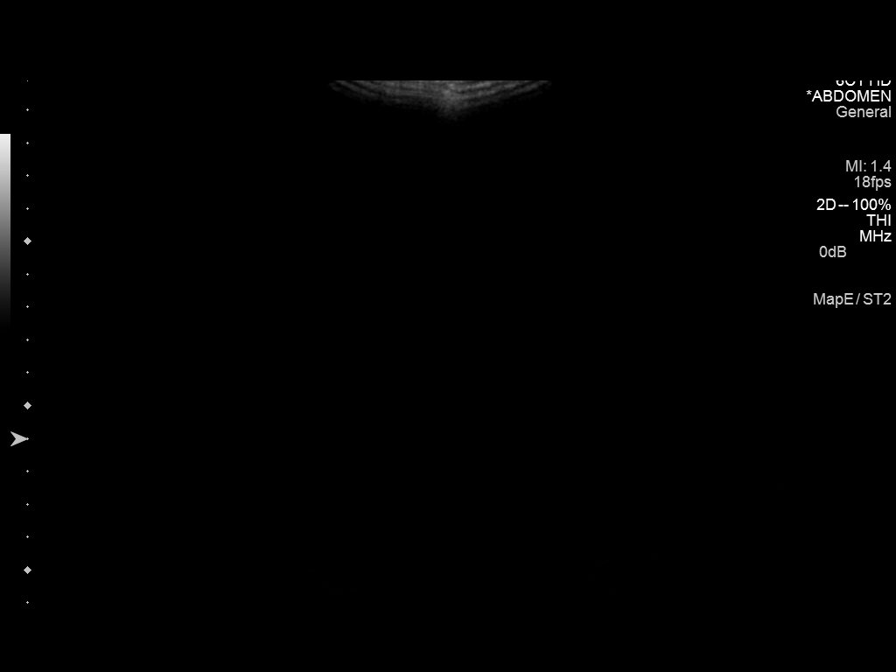
[im 4/8]
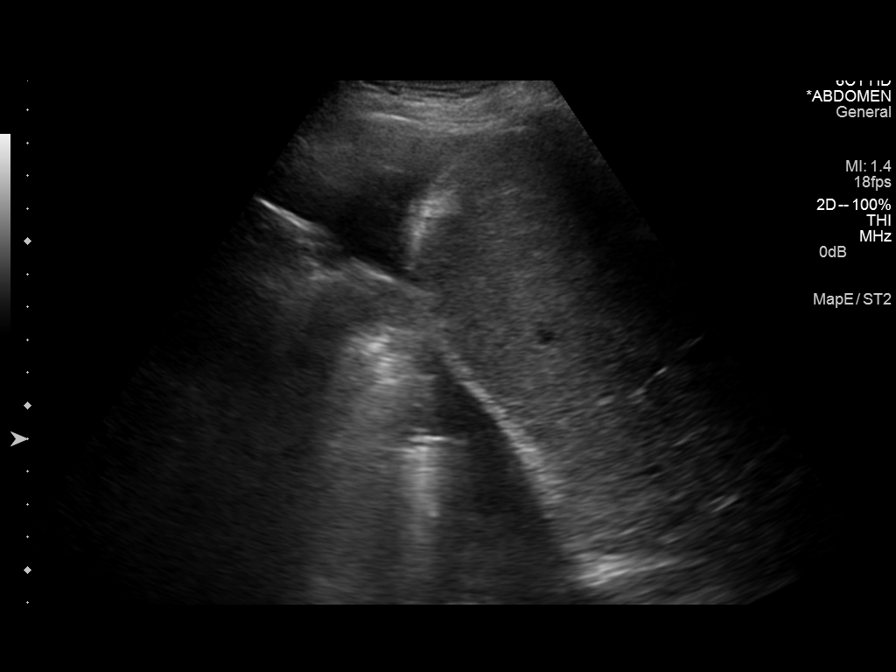
[im 5/8]
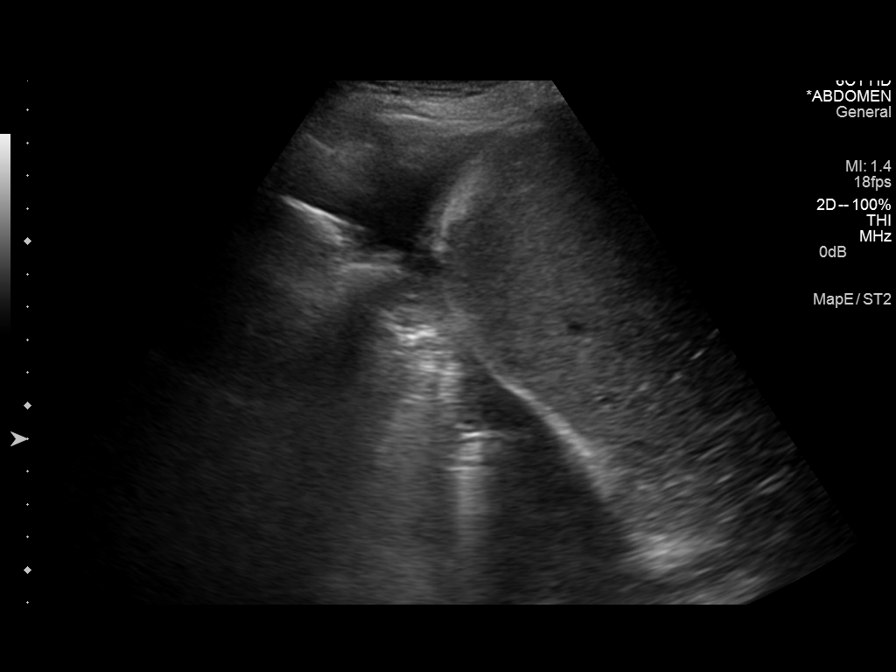
[im 6/8]
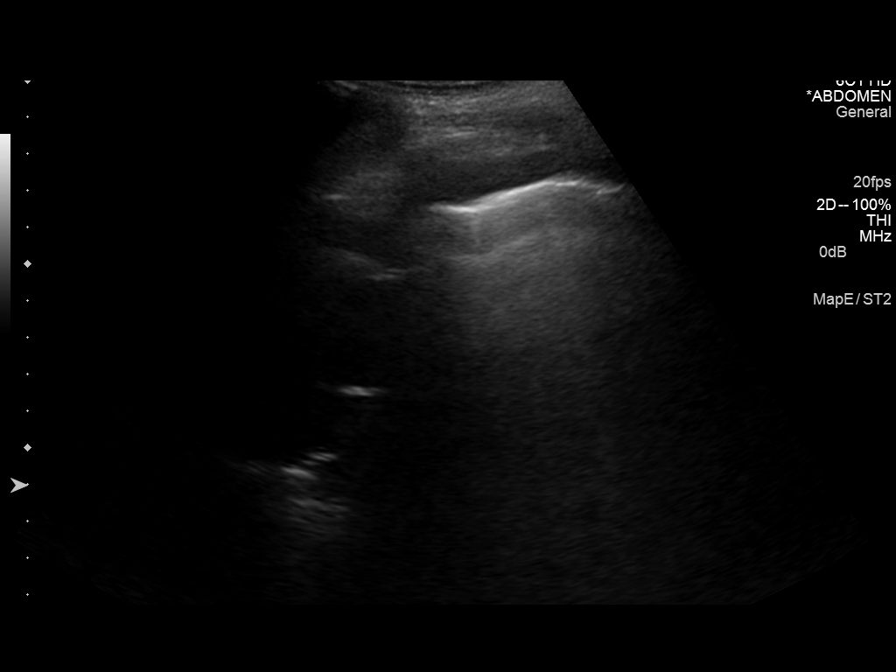
[im 7/8]
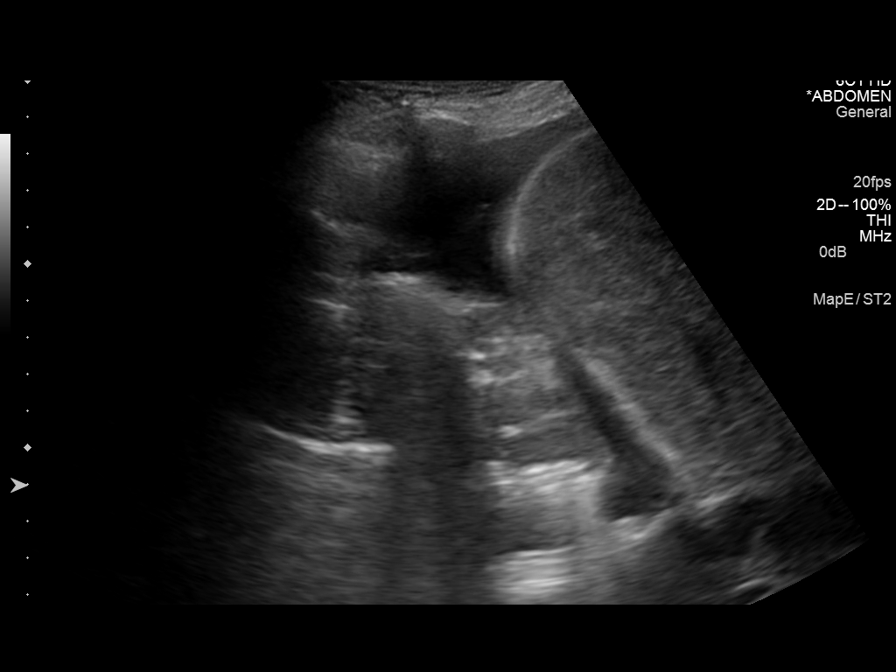
[im 8/8]
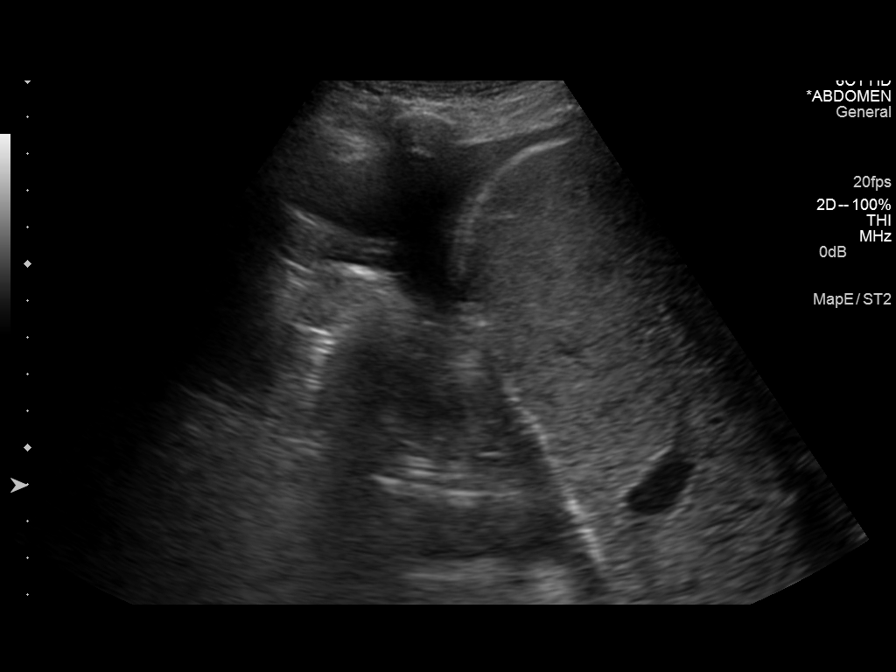

[8 of 8 positions shown; findings below may reference images not displayed]

FINDINGS: Very tiny right pleural effusion present. No thoracentesis
performed.
IMPRESSION: Very tiny right pleural effusion present. No thoracentesis
performed.
